# Patient Record
Sex: Female | Born: 1940 | Race: White | Hispanic: No | Smoking: Never smoker
Health system: Southern US, Community
[De-identification: ages and names within clinical notes are randomized; demographics above are authoritative.]

## PROBLEM LIST (undated history)

## (undated) DIAGNOSIS — M1990 Unspecified osteoarthritis, unspecified site: Secondary | ICD-10-CM

## (undated) DIAGNOSIS — I10 Essential (primary) hypertension: Secondary | ICD-10-CM

---

## 2011-01-30 DEATH — deceased

## 2017-02-05 ENCOUNTER — Emergency Department (HOSPITAL_COMMUNITY)
Admission: EM | Admit: 2017-02-05 | Discharge: 2017-02-06 | Disposition: A | Discharge status: Home / Self Care | Payer: Medicare HMO | Attending: Emergency Medicine | Admitting: Emergency Medicine

## 2017-02-05 ENCOUNTER — Emergency Department (HOSPITAL_COMMUNITY): Payer: Medicare HMO

## 2017-02-05 ENCOUNTER — Encounter (HOSPITAL_COMMUNITY): Payer: Self-pay | Admitting: Emergency Medicine

## 2017-02-05 DIAGNOSIS — R109 Unspecified abdominal pain: Secondary | ICD-10-CM | POA: Diagnosis present

## 2017-02-05 DIAGNOSIS — I10 Essential (primary) hypertension: Secondary | ICD-10-CM | POA: Insufficient documentation

## 2017-02-05 DIAGNOSIS — R1031 Right lower quadrant pain: Secondary | ICD-10-CM | POA: Diagnosis not present

## 2017-02-05 HISTORY — DX: Unspecified osteoarthritis, unspecified site: M19.90

## 2017-02-05 HISTORY — DX: Essential (primary) hypertension: I10

## 2017-02-05 LAB — CBC
HCT: 41.3 % (ref 36.0–46.0)
HEMOGLOBIN: 14.1 g/dL (ref 12.0–15.0)
MCH: 31.1 pg (ref 26.0–34.0)
MCHC: 34.1 g/dL (ref 30.0–36.0)
MCV: 91 fL (ref 78.0–100.0)
Platelets: 229 10*3/uL (ref 150–400)
RBC: 4.54 MIL/uL (ref 3.87–5.11)
RDW: 12.6 % (ref 11.5–15.5)
WBC: 9.4 10*3/uL (ref 4.0–10.5)

## 2017-02-05 LAB — COMPREHENSIVE METABOLIC PANEL
ALBUMIN: 4.1 g/dL (ref 3.5–5.0)
ALK PHOS: 58 U/L (ref 38–126)
ALT: 14 U/L (ref 14–54)
ANION GAP: 11 (ref 5–15)
AST: 21 U/L (ref 15–41)
BILIRUBIN TOTAL: 0.3 mg/dL (ref 0.3–1.2)
BUN: 14 mg/dL (ref 6–20)
CALCIUM: 9.2 mg/dL (ref 8.9–10.3)
CO2: 22 mmol/L (ref 22–32)
Chloride: 104 mmol/L (ref 101–111)
Creatinine, Ser: 0.91 mg/dL (ref 0.44–1.00)
GFR, EST NON AFRICAN AMERICAN: 59 mL/min — AB (ref >60)
GLUCOSE: 118 mg/dL — AB (ref 65–99)
Potassium: 3.9 mmol/L (ref 3.5–5.1)
Sodium: 137 mmol/L (ref 135–145)
TOTAL PROTEIN: 7.2 g/dL (ref 6.5–8.1)

## 2017-02-05 LAB — URINALYSIS, ROUTINE W REFLEX MICROSCOPIC
BILIRUBIN URINE: NEGATIVE
Glucose, UA: NEGATIVE mg/dL
Hgb urine dipstick: NEGATIVE
KETONES UR: NEGATIVE mg/dL
Leukocytes, UA: NEGATIVE
NITRITE: NEGATIVE
Protein, ur: NEGATIVE mg/dL
SPECIFIC GRAVITY, URINE: 1.017 (ref 1.005–1.030)
pH: 6 (ref 5.0–8.0)

## 2017-02-05 LAB — LIPASE, BLOOD: Lipase: 31 U/L (ref 11–51)

## 2017-02-05 MED ORDER — ONDANSETRON 4 MG PO TBDP
4.0000 mg | ORAL_TABLET | Freq: Once | ORAL | Status: AC | PRN
  Administered 2017-02-05: 4 mg via ORAL

## 2017-02-05 MED ORDER — IOPAMIDOL (ISOVUE-300) INJECTION 61%
INTRAVENOUS | Status: AC
  Administered 2017-02-05: 100 mL
  Filled 2017-02-05: qty 100

## 2017-02-05 MED ORDER — SODIUM CHLORIDE 0.9 % IV BOLUS (SEPSIS)
500.0000 mL | Freq: Once | INTRAVENOUS | Status: AC
  Administered 2017-02-05: 500 mL via INTRAVENOUS

## 2017-02-05 MED ORDER — ONDANSETRON 4 MG PO TBDP
ORAL_TABLET | ORAL | Status: AC
  Filled 2017-02-05: qty 1

## 2017-02-05 MED ORDER — ONDANSETRON HCL 4 MG/2ML IJ SOLN
4.0000 mg | Freq: Once | INTRAMUSCULAR | Status: AC
  Administered 2017-02-05: 4 mg via INTRAVENOUS
  Filled 2017-02-05: qty 2

## 2017-02-05 MED ORDER — MORPHINE SULFATE (PF) 4 MG/ML IV SOLN
4.0000 mg | Freq: Once | INTRAVENOUS | Status: AC
  Administered 2017-02-05: 4 mg via INTRAVENOUS
  Filled 2017-02-05: qty 1

## 2017-02-05 NOTE — ED Notes (Signed)
Patient transported to CT 

## 2017-02-05 NOTE — ED Triage Notes (Signed)
Pt to ER for evaluation of RLQ abdominal pain onset yesterday. [facility] and sent here for further evaluation. Reports nausea since yesterday and one episode of vomiting today. Denies diarrhea. Pt a/o x4. Reports hx of HTN. From [place], visiting family here.

## 2017-02-06 ENCOUNTER — Emergency Department
Admission: EM | Admit: 2017-02-06 | Discharge: 2017-02-06 | Disposition: A | Discharge status: Home / Self Care | Payer: Medicare HMO | Attending: Emergency Medicine | Admitting: Emergency Medicine

## 2017-02-06 ENCOUNTER — Encounter: Payer: Self-pay | Admitting: Emergency Medicine

## 2017-02-06 DIAGNOSIS — I10 Essential (primary) hypertension: Secondary | ICD-10-CM | POA: Diagnosis not present

## 2017-02-06 DIAGNOSIS — R1011 Right upper quadrant pain: Secondary | ICD-10-CM | POA: Insufficient documentation

## 2017-02-06 LAB — COMPREHENSIVE METABOLIC PANEL
ALBUMIN: 4.3 g/dL (ref 3.5–5.0)
ALT: 14 U/L (ref 14–54)
AST: 27 U/L (ref 15–41)
Alkaline Phosphatase: 63 U/L (ref 38–126)
Anion gap: 12 (ref 5–15)
BILIRUBIN TOTAL: 0.7 mg/dL (ref 0.3–1.2)
BUN: 17 mg/dL (ref 6–20)
CHLORIDE: 103 mmol/L (ref 101–111)
CO2: 24 mmol/L (ref 22–32)
Calcium: 9.3 mg/dL (ref 8.9–10.3)
Creatinine, Ser: 0.93 mg/dL (ref 0.44–1.00)
GFR calc Af Amer: >60 mL/min (ref >60)
GFR calc non Af Amer: 58 mL/min — ABNORMAL LOW (ref >60)
GLUCOSE: 106 mg/dL — AB (ref 65–99)
POTASSIUM: 3.5 mmol/L (ref 3.5–5.1)
Sodium: 139 mmol/L (ref 135–145)
TOTAL PROTEIN: 7.8 g/dL (ref 6.5–8.1)

## 2017-02-06 LAB — CBC
HEMATOCRIT: 41.8 % (ref 35.0–47.0)
HEMOGLOBIN: 14.5 g/dL (ref 12.0–16.0)
MCH: 32.3 pg (ref 26.0–34.0)
MCHC: 34.7 g/dL (ref 32.0–36.0)
MCV: 93.2 fL (ref 80.0–100.0)
Platelets: 205 10*3/uL (ref 150–440)
RBC: 4.48 MIL/uL (ref 3.80–5.20)
RDW: 12.6 % (ref 11.5–14.5)
WBC: 10 10*3/uL (ref 3.6–11.0)

## 2017-02-06 LAB — LIPASE, BLOOD: Lipase: 33 U/L (ref 11–51)

## 2017-02-06 MED ORDER — ONDANSETRON 4 MG PO TBDP: 4.0000 mg | ORAL_TABLET | Freq: Three times a day (TID) | ORAL | 0 refills | Status: AC | PRN

## 2017-02-06 MED ORDER — MORPHINE SULFATE (PF) 4 MG/ML IV SOLN
4.0000 mg | Freq: Once | INTRAVENOUS | Status: AC
  Administered 2017-02-06: 4 mg via INTRAVENOUS
  Filled 2017-02-06: qty 1

## 2017-02-06 MED ORDER — MORPHINE SULFATE (PF) 4 MG/ML IV SOLN: 4.0000 mg | Freq: Once | INTRAVENOUS | Status: DC

## 2017-02-06 MED ORDER — PROMETHAZINE HCL 12.5 MG PO TABS: 12.5000 mg | ORAL_TABLET | Freq: Four times a day (QID) | ORAL | 0 refills | Status: AC | PRN

## 2017-02-06 MED ORDER — SODIUM CHLORIDE 0.9 % IV SOLN
1000.0000 mL | Freq: Once | INTRAVENOUS | Status: AC
  Administered 2017-02-06: 1000 mL via INTRAVENOUS

## 2017-02-06 MED ORDER — ONDANSETRON HCL 4 MG/2ML IJ SOLN
4.0000 mg | Freq: Once | INTRAMUSCULAR | Status: AC
  Administered 2017-02-06: 4 mg via INTRAVENOUS
  Filled 2017-02-06: qty 2

## 2017-02-06 MED ORDER — PROMETHAZINE HCL 25 MG/ML IJ SOLN
12.5000 mg | Freq: Once | INTRAMUSCULAR | Status: AC
  Administered 2017-02-06: 12.5 mg via INTRAVENOUS
  Filled 2017-02-06: qty 1

## 2017-02-06 NOTE — ED Notes (Signed)
Pt reports she is having right sided pain for the last 3 days - pt reports nausea and vomiting but denies diarrhea - c/o headache - pt decreased appetite for the last 2 days - pt is able to drink - pt went to the er last pm and had labs and a CT scan (nothing was found per pt) - pt states she returned to the er today because the pain is still present

## 2017-02-06 NOTE — ED Provider Notes (Signed)
[facility] Emergency Department Provider Note   ____________________________________________    I have reviewed the triage vital signs and the nursing notes.   HISTORY  Chief Complaint Abdominal Pain     HPI Sara Cox is a 76 y.o. female who p/w right mid abdominal pain/flank pain x 2 days. She reports the pain makes her nauseated. Seen at [facility] ED last night had normal labs and CT. Pain has continued today. She has not taken any pain medication. Has taken zofran without relief. Reports she was recently mowing her lawn and thinks she may have overdone it. No fevers/chills. No cp. No vomiting. No diarrhea. ?constipation   Past Medical History:  Diagnosis Date  . Arthritis   . Hypertension     There are no active problems to display for this patient.   History reviewed. No pertinent surgical history.  Prior to Admission medications   Medication Sig Start Date End Date Taking? Authorizing Provider  ondansetron (ZOFRAN ODT) 4 MG disintegrating tablet Take 1 tablet (4 mg total) by mouth every 8 (eight) hours as needed. 02/06/17   Jerelyn Scott, MD  promethazine (PHENERGAN) 12.5 MG tablet Take 1 tablet (12.5 mg total) by mouth every 6 (six) hours as needed for nausea or vomiting. 02/06/17   Jene Every, MD     Allergies Penicillins  History reviewed. No pertinent family history.  Social History Social History  Substance Use Topics  . Smoking status: Never Smoker  . Smokeless tobacco: Never Used  . Alcohol use No    Review of Systems  Constitutional: No fever/chills Eyes: No visual changes.  ENT: No sore throat. Cardiovascular: Denies chest pain. Respiratory: Denies shortness of breath. Gastrointestinal: as above Genitourinary: Negative for dysuria. Musculoskeletal: chronic back pain. Skin: Negative for rash. Neurological: Negative for headaches or weakness   ____________________________________________   PHYSICAL  EXAM:  VITAL SIGNS: ED Triage Vitals  Enc Vitals Group     BP 02/06/17 1320 (!) 143/72     Pulse Rate 02/06/17 1320 70     Resp 02/06/17 1320 18     Temp 02/06/17 1320 98.7 F (37.1 C)     Temp Source 02/06/17 1320 Oral     SpO2 02/06/17 1320 97 %     Weight 02/06/17 1321 140 lb (63.5 kg)     Height 02/06/17 1321 5\' 3"  (1.6 m)     Head Circumference --      Peak Flow --      Pain Score 02/06/17 1320 8     Pain Loc --      Pain Edu? --      Excl. in GC? --     Constitutional: Alert and oriented. No acute distress. Pleasant and interactive  Nose: No congestion/rhinnorhea. Mouth/Throat: Mucous membranes are moist.   Neck:  Painless ROM Cardiovascular: Normal rate, regular rhythm. Grossly normal heart sounds.  Good peripheral circulation. Respiratory: Normal respiratory effort.  No retractions. Lungs CTAB. Gastrointestinal: Soft and nontender. No distention.  No CVA tenderness. Benign abdomen exam. Minimal ttp along the right flank, ?oblique injury, worse with rotation of torso Genitourinary: deferred Musculoskeletal:  Warm and well perfused Neurologic:  Normal speech and language. No gross focal neurologic deficits are appreciated.  Skin:  Skin is warm, dry and intact. No rash noted. Psychiatric: Mood and affect are normal. Speech and behavior are normal.  ____________________________________________   LABS (all labs ordered are listed, but only abnormal results are displayed)  Labs Reviewed  COMPREHENSIVE METABOLIC  PANEL - Abnormal; Notable for the following:       Result Value   Glucose, Bld 106 (*)    GFR calc non Af Amer 58 (*)    All other components within normal limits  CBC  LIPASE, BLOOD   ____________________________________________  EKG  none ____________________________________________  RADIOLOGY  Reviewed CT ____________________________________________   PROCEDURES  Procedure(s) performed: No    Critical Care performed:  No ____________________________________________   INITIAL IMPRESSION / ASSESSMENT AND PLAN / ED COURSE  Pertinent labs & imaging results that were available during my care of the patient were reviewed by me and considered in my medical decision making (see chart for details).  Patient with right flank pain, seems musculoskeletal. Exam is very reassuring. However given return to ED will recheck labs, give pain medications and evaluate to see if further imaging necessary. Differential MS, constipation, gall bladder, appy, pancreatitis, however zero ttp of abdomen so very unlikely  Patient had significant relief with pain medications. Recommended admission given unclear diagnosis for observation. Patient declined and family reports they are fine taking her home and are comfortable returning if she does not have improvement with pain medication. Strict return precautions discussed    ____________________________________________   FINAL CLINICAL IMPRESSION(S) / ED DIAGNOSES  Final diagnoses:  Right upper quadrant abdominal pain      NEW MEDICATIONS STARTED DURING THIS VISIT:  New Prescriptions   PROMETHAZINE (PHENERGAN) 12.5 MG TABLET    Take 1 tablet (12.5 mg total) by mouth every 6 (six) hours as needed for nausea or vomiting.     Note:  This document was prepared using Dragon voice recognition software and may include unintentional dictation errors.    Jene EveryKinner, Coby Shrewsberry, MD 02/06/17 [phone]

## 2017-02-06 NOTE — ED Notes (Signed)
IV infiltrated - Dr Cyril LoosenKinner notified - new IV to be started

## 2017-02-06 NOTE — ED Notes (Signed)
Pt states that she does not need anymore pain medicine at this time. Fluids infusing.

## 2017-02-06 NOTE — Discharge Instructions (Signed)
Return to the ED with any concerns including vomiting and not able to keep down liquids or your medications, abdominal pain especially if it localizes to the right lower abdomen, fever or chills, and decreased urine output, decreased level of alertness or lethargy, or any other alarming symptoms.  °

## 2017-02-06 NOTE — ED Notes (Signed)
Pt departed in NAD, refused use of wheelchair.  

## 2017-02-06 NOTE — ED Triage Notes (Signed)
Pt to ed with c/o right lower quad pain that started yesterday,  Pt states pain is constant but level of pain varies.  Pt reports was seen [facility] last night but the pain continues.

## 2017-02-06 NOTE — ED Notes (Signed)
Spoke with Dr Don PerkingVeronese regarding pt.  No orders received at this time.  Dr Don PerkingVeronese [facility]

## 2017-02-06 NOTE — ED Provider Notes (Signed)
[facility]-EMERGENCY DEPT Provider Note   CSN: [id number] Arrival date & time: 02/05/17  1825     History   Chief Complaint Chief Complaint  Patient presents with  . Abdominal Pain    HPI Sara Cox is a 76 y.o. female.  HPI  Pt presenting with c/o right sided abdominal pain.  Pain began last night and gradually worsened.  Kept her from sleeping all night.  Had an episode of vomiting today.  Also some nausea, decreased appetite today.  No fever/chills.  No dysuria.  No back or flank pain.  Pt was seen at an urgent care and referred to the ED for further evaluation of abdominal pain.  There are no other associated systemic symptoms, there are no other alleviating or modifying factors.   Past Medical History:  Diagnosis Date  . Arthritis   . Hypertension     There are no active problems to display for this patient.   History reviewed. No pertinent surgical history.  OB History    No data available       Home Medications    Prior to Admission medications   Medication Sig Start Date End Date Taking? Authorizing Provider  ondansetron (ZOFRAN ODT) 4 MG disintegrating tablet Take 1 tablet (4 mg total) by mouth every 8 (eight) hours as needed. 02/06/17   Jerelyn ScottLinker, Jaydeen Darley, MD    Family History History reviewed. No pertinent family history.  Social History Social History  Substance Use Topics  . Smoking status: Never Smoker  . Smokeless tobacco: Never Used  . Alcohol use No     Allergies   Penicillins   Review of Systems Review of Systems  ROS reviewed and all otherwise negative except for mentioned in HPI   Physical Exam Updated Vital Signs BP 127/63   Pulse 74   Temp 98.9 F (37.2 C) (Oral)   Resp 18   SpO2 100%  Vitals reviewed Physical Exam Physical Examination: General appearance - alert, well appearing, and in no distress Mental status - alert, oriented to person, place, and time Eyes - no conjunctival injection, no scleral icterus Mouth - mucous  membranes moist, pharynx normal without lesions Neck - supple, no significant adenopathy Chest - clear to auscultation, no wheezes, rales or rhonchi, symmetric air entry Heart - normal rate, regular rhythm, normal S1, S2, no murmurs, rubs, clicks or gallops Abdomen - soft, mild ttp over right mid abdomen and lower quadrant, nabs, nondistended, no masses or organomegaly Neurological - alert, oriented, normal speech Extremities - peripheral pulses normal, no pedal edema, no clubbing or cyanosis Skin - normal coloration and turgor, no rashes  ED Treatments / Results  Labs (all labs ordered are listed, but only abnormal results are displayed) Labs Reviewed  COMPREHENSIVE METABOLIC PANEL - Abnormal; Notable for the following:       Result Value   Glucose, Bld 118 (*)    GFR calc non Af Amer 59 (*)    All other components within normal limits  LIPASE, BLOOD  CBC  URINALYSIS, ROUTINE W REFLEX MICROSCOPIC    EKG  EKG Interpretation None       Radiology Ct Abdomen Pelvis W Contrast  Result Date: 02/05/2017 CLINICAL DATA:  Right lower quadrant pain, nausea, and vomiting since last night. Hypertension. EXAM: CT ABDOMEN AND PELVIS WITH CONTRAST TECHNIQUE: Multidetector CT imaging of the abdomen and pelvis was performed using the standard protocol following bolus administration of intravenous contrast. CONTRAST:  100mL ISOVUE-300 IOPAMIDOL (ISOVUE-300) INJECTION 61% COMPARISON:  None.  FINDINGS: Lower chest: Dependent atelectasis in the lung bases. Calcified granuloma in the left lung base. Calcified left mediastinal lymph node. Coronary artery calcification. Hepatobiliary: No focal liver abnormality is seen. No gallstones, gallbladder wall thickening, or biliary dilatation. Pancreas: Unremarkable. No pancreatic ductal dilatation or surrounding inflammatory changes. Spleen: Normal in size without focal abnormality. Adrenals/Urinary Tract: No adrenal gland nodules. Areas of scarring in both kidneys,  greater on the right, likely chronic. Subcentimeter low-attenuation lesions in both kidneys likely representing cysts. Nephrograms are otherwise homogeneous and symmetrical. No hydronephrosis or hydroureter. Bladder appears normal. Stomach/Bowel: Stomach, small bowel, and colon are decompressed. Scattered diverticula in the colon. No evidence of diverticulitis. The appendix is normal. Vascular/Lymphatic: Aortic atherosclerosis. No enlarged abdominal or pelvic lymph nodes. Reproductive: Uterus and bilateral adnexa are unremarkable. Other: No abdominal wall hernia or abnormality. No abdominopelvic ascites. Musculoskeletal: No acute or significant osseous findings. IMPRESSION: No acute process demonstrated in the abdomen or pelvis. No evidence of bowel obstruction or inflammation. Appendix is normal. Chronic renal parenchymal scarring. Aortic atherosclerosis. Electronically Signed   By: Burman Nieves M.D.   On: 02/05/2017 23:51    Procedures Procedures (including critical care time)  Medications Ordered in ED Medications  ondansetron (ZOFRAN-ODT) disintegrating tablet 4 mg (4 mg Oral Given 02/05/17 1858)  sodium chloride 0.9 % bolus 500 mL (0 mLs Intravenous Stopped 02/05/17 2341)  ondansetron (ZOFRAN) injection 4 mg (4 mg Intravenous Given 02/05/17 2241)  morphine 4 MG/ML injection 4 mg (4 mg Intravenous Given 02/05/17 2241)  iopamidol (ISOVUE-300) 61 % injection (100 mLs  Contrast Given 02/05/17 2324)     Initial Impression / Assessment and Plan / ED Course  I have reviewed the triage vital signs and the nursing notes.  Pertinent labs & imaging results that were available during my care of the patient were reviewed by me and considered in my medical decision making (see chart for details).     Pt presenting with c/o right lower abdominal pain, labs are reassuring, abdominal CT scan obtained which is also reassuring.  Pt treated with pain and nausea meds and was able to tolerate a fluids challenge.  No  findings of appendicitis, no diverticulits, SBO, kidney stone, or other emergent fidings in the ED.  Advised f/u with PMD if symptoms persist.  Discharged with strict return precautions.  Pt agreeable with plan.  Final Clinical Impressions(s) / ED Diagnoses   Final diagnoses:  Right lower quadrant abdominal pain    New Prescriptions Discharge Medication List as of 02/06/2017 12:30 AM    START taking these medications   Details  ondansetron (ZOFRAN ODT) 4 MG disintegrating tablet Take 1 tablet (4 mg total) by mouth every 8 (eight) hours as needed., Starting Wed 02/06/2017, Print         Jerelyn Scott, MD 02/06/17 [phone]

## 2017-02-12 ENCOUNTER — Emergency Department: Payer: Medicare HMO

## 2017-02-12 ENCOUNTER — Encounter: Payer: Self-pay | Admitting: Emergency Medicine

## 2017-02-12 ENCOUNTER — Observation Stay
Admission: EM | Admit: 2017-02-12 | Discharge: 2017-02-13 | Disposition: A | Discharge status: Home / Self Care | Payer: Medicare HMO | Attending: Internal Medicine | Admitting: Internal Medicine

## 2017-02-12 DIAGNOSIS — Z79899 Other long term (current) drug therapy: Secondary | ICD-10-CM | POA: Diagnosis not present

## 2017-02-12 DIAGNOSIS — K319 Disease of stomach and duodenum, unspecified: Secondary | ICD-10-CM | POA: Diagnosis not present

## 2017-02-12 DIAGNOSIS — R1110 Vomiting, unspecified: Secondary | ICD-10-CM

## 2017-02-12 DIAGNOSIS — R112 Nausea with vomiting, unspecified: Secondary | ICD-10-CM | POA: Diagnosis not present

## 2017-02-12 DIAGNOSIS — F419 Anxiety disorder, unspecified: Secondary | ICD-10-CM | POA: Diagnosis not present

## 2017-02-12 DIAGNOSIS — K2900 Acute gastritis without bleeding: Secondary | ICD-10-CM | POA: Insufficient documentation

## 2017-02-12 DIAGNOSIS — K760 Fatty (change of) liver, not elsewhere classified: Secondary | ICD-10-CM | POA: Diagnosis not present

## 2017-02-12 DIAGNOSIS — K2950 Unspecified chronic gastritis without bleeding: Secondary | ICD-10-CM | POA: Insufficient documentation

## 2017-02-12 DIAGNOSIS — B9681 Helicobacter pylori [H. pylori] as the cause of diseases classified elsewhere: Secondary | ICD-10-CM | POA: Insufficient documentation

## 2017-02-12 DIAGNOSIS — R1011 Right upper quadrant pain: Secondary | ICD-10-CM | POA: Insufficient documentation

## 2017-02-12 DIAGNOSIS — I10 Essential (primary) hypertension: Secondary | ICD-10-CM | POA: Diagnosis not present

## 2017-02-12 LAB — COMPREHENSIVE METABOLIC PANEL
ALT: 11 U/L — AB (ref 14–54)
AST: 18 U/L (ref 15–41)
Albumin: 4.3 g/dL (ref 3.5–5.0)
Alkaline Phosphatase: 61 U/L (ref 38–126)
Anion gap: 11 (ref 5–15)
BILIRUBIN TOTAL: 0.8 mg/dL (ref 0.3–1.2)
BUN: 20 mg/dL (ref 6–20)
CO2: 27 mmol/L (ref 22–32)
CREATININE: 0.92 mg/dL (ref 0.44–1.00)
Calcium: 9.4 mg/dL (ref 8.9–10.3)
Chloride: 101 mmol/L (ref 101–111)
GFR calc Af Amer: >60 mL/min (ref >60)
GFR, EST NON AFRICAN AMERICAN: 59 mL/min — AB (ref >60)
Glucose, Bld: 115 mg/dL — ABNORMAL HIGH (ref 65–99)
Potassium: 4.2 mmol/L (ref 3.5–5.1)
Sodium: 139 mmol/L (ref 135–145)
TOTAL PROTEIN: 7.7 g/dL (ref 6.5–8.1)

## 2017-02-12 LAB — CBC
HCT: 43.5 % (ref 35.0–47.0)
Hemoglobin: 14.9 g/dL (ref 12.0–16.0)
MCH: 31.4 pg (ref 26.0–34.0)
MCHC: 34.3 g/dL (ref 32.0–36.0)
MCV: 91.6 fL (ref 80.0–100.0)
PLATELETS: 234 10*3/uL (ref 150–440)
RBC: 4.75 MIL/uL (ref 3.80–5.20)
RDW: 12.8 % (ref 11.5–14.5)
WBC: 8.6 10*3/uL (ref 3.6–11.0)

## 2017-02-12 LAB — URINALYSIS, COMPLETE (UACMP) WITH MICROSCOPIC
BILIRUBIN URINE: NEGATIVE
Bacteria, UA: NONE SEEN
Glucose, UA: NEGATIVE mg/dL
HGB URINE DIPSTICK: NEGATIVE
Ketones, ur: NEGATIVE mg/dL
LEUKOCYTES UA: NEGATIVE
NITRITE: NEGATIVE
PH: 7 (ref 5.0–8.0)
Protein, ur: NEGATIVE mg/dL
SPECIFIC GRAVITY, URINE: 1.012 (ref 1.005–1.030)

## 2017-02-12 LAB — LIPASE, BLOOD: Lipase: 38 U/L (ref 11–51)

## 2017-02-12 MED ORDER — ACETAMINOPHEN 650 MG RE SUPP: 650.0000 mg | Freq: Four times a day (QID) | RECTAL | Status: DC | PRN

## 2017-02-12 MED ORDER — HYDROCODONE-ACETAMINOPHEN 5-325 MG PO TABS: 1.0000 | ORAL_TABLET | Freq: Four times a day (QID) | ORAL | Status: DC | PRN

## 2017-02-12 MED ORDER — MORPHINE SULFATE (PF) 2 MG/ML IV SOLN
2.0000 mg | Freq: Once | INTRAVENOUS | Status: AC
  Administered 2017-02-12: 2 mg via INTRAVENOUS
  Filled 2017-02-12: qty 1

## 2017-02-12 MED ORDER — PANTOPRAZOLE SODIUM 40 MG IV SOLR
40.0000 mg | Freq: Two times a day (BID) | INTRAVENOUS | Status: DC
Start: 2017-02-12 — End: 2017-02-13
  Administered 2017-02-12 – 2017-02-13 (×2): 40 mg via INTRAVENOUS
  Filled 2017-02-12 (×2): qty 40

## 2017-02-12 MED ORDER — LISINOPRIL 20 MG PO TABS
20.0000 mg | ORAL_TABLET | Freq: Every day | ORAL | Status: DC
  Administered 2017-02-13: 20 mg via ORAL
  Filled 2017-02-12 (×2): qty 1

## 2017-02-12 MED ORDER — LORAZEPAM 1 MG PO TABS
1.0000 mg | ORAL_TABLET | Freq: Two times a day (BID) | ORAL | Status: DC | PRN
  Administered 2017-02-12: 1 mg via ORAL
  Filled 2017-02-12: qty 1

## 2017-02-12 MED ORDER — ONDANSETRON HCL 4 MG PO TABS: 4.0000 mg | ORAL_TABLET | Freq: Four times a day (QID) | ORAL | Status: DC | PRN

## 2017-02-12 MED ORDER — MORPHINE SULFATE (PF) 2 MG/ML IV SOLN
2.0000 mg | INTRAVENOUS | Status: DC | PRN
  Administered 2017-02-12 – 2017-02-13 (×3): 2 mg via INTRAVENOUS
  Filled 2017-02-12 (×3): qty 1

## 2017-02-12 MED ORDER — ONDANSETRON HCL 4 MG/2ML IJ SOLN
4.0000 mg | Freq: Four times a day (QID) | INTRAMUSCULAR | Status: DC | PRN
  Administered 2017-02-12 – 2017-02-13 (×2): 4 mg via INTRAVENOUS
  Filled 2017-02-12: qty 2

## 2017-02-12 MED ORDER — ONDANSETRON HCL 4 MG/2ML IJ SOLN
4.0000 mg | Freq: Once | INTRAMUSCULAR | Status: AC
  Administered 2017-02-12: 4 mg via INTRAVENOUS
  Filled 2017-02-12: qty 2

## 2017-02-12 MED ORDER — ACETAMINOPHEN 325 MG PO TABS
650.0000 mg | ORAL_TABLET | Freq: Four times a day (QID) | ORAL | Status: DC | PRN
  Administered 2017-02-13: 650 mg via ORAL
  Filled 2017-02-12: qty 2

## 2017-02-12 MED ORDER — SODIUM CHLORIDE 0.9 % IV BOLUS (SEPSIS)
1000.0000 mL | Freq: Once | INTRAVENOUS | Status: AC
  Administered 2017-02-12: 1000 mL via INTRAVENOUS

## 2017-02-12 MED ORDER — ENOXAPARIN SODIUM 40 MG/0.4ML ~~LOC~~ SOLN
40.0000 mg | SUBCUTANEOUS | Status: DC
  Administered 2017-02-12: 40 mg via SUBCUTANEOUS
  Filled 2017-02-12: qty 0.4

## 2017-02-12 MED ORDER — SODIUM CHLORIDE 0.9 % IV SOLN
INTRAVENOUS | Status: DC
  Administered 2017-02-12 – 2017-02-13 (×3): via INTRAVENOUS

## 2017-02-12 NOTE — Consult Note (Signed)
Wyline MoodKiran Tylique Aull MD  [address] [place], [place] [address] Phone: [phone] Fax : [phone]  Consultation  Referring Provider:     DR Cherlynn KaiserSainani Primary Care Physician:  Patient, No Pcp Per Primary Gastroenterologist:  None          Reason for Consultation:     Nausea . Vomiting   Date of Admission:  02/12/2017 Date of Consultation:  02/12/2017         HPI:   Sara Cox is a 76 y.o. female who is visiting from [place]. She states she was doing well and has never been admitted to the hospital before. All of a sudden yesterday evening developed RUQ pain followed by nausea and vomiting last Monday  ,she was not able to keep any food down She was seen at[facility] on 02/05/2017 and states she was discharged after imaging , she went home and was unable to keep any food down and returned to the ER at [facility]. Since admission no vomiting and able to keep some fluids down. Denies any NSAID use or abdominal pain presently. No complaints presently . Denies any fevers or diarrhea or sick contacts.   RUQ USG showed hepatic steatosis  CT abdomen on 02/05/17 was normal   Past Medical History:  Diagnosis Date  . Arthritis   . Hypertension     History reviewed. No pertinent surgical history.  Prior to Admission medications   Medication Sig Start Date End Date Taking? Authorizing Provider  HYDROcodone-acetaminophen (NORCO/VICODIN) 5-325 MG tablet Take 1 tablet by mouth every 6 (six) hours as needed. 12/13/16  Yes [provider]  lisinopril (PRINIVIL,ZESTRIL) 20 MG tablet Take 20 mg by mouth daily. 12/18/16  Yes [provider]  LORazepam (ATIVAN) 1 MG tablet Take 1 mg by mouth 2 (two) times daily as needed. 01/15/17  Yes [provider]  ondansetron (ZOFRAN ODT) 4 MG disintegrating tablet Take 1 tablet (4 mg total) by mouth every 8 (eight) hours as needed. 02/06/17  Yes Jerelyn ScottLinker, Martha, MD  promethazine (PHENERGAN) 12.5 MG tablet Take 1 tablet (12.5 mg total) by mouth every 6  (six) hours as needed for nausea or vomiting. 02/06/17  Yes Jene EveryKinner, Robert, MD    Family History  Problem Relation Age of Onset  . Kidney disease Mother   . Heart attack Father   . Lupus Sister      Social History  Substance Use Topics  . Smoking status: Never Smoker  . Smokeless tobacco: Never Used  . Alcohol use No    Allergies as of 02/12/2017 - Review Complete 02/12/2017  Allergen Reaction Noted  . Penicillins Rash 02/05/2017    Review of Systems:    All systems reviewed and negative except where noted in HPI.   Physical Exam:  Vital signs in last 24 hours: Temp:  [98.5 F (36.9 C)-98.9 F (37.2 C)] 98.5 F (36.9 C) (05/15 2014) Pulse Rate:  [64-73] 69 (05/15 2014) Resp:  [16-18] 16 (05/15 2014) BP: (136-168)/(56-84) 136/65 (05/15 2014) SpO2:  [96 %-100 %] 98 % (05/15 2014) Weight:  [140 lb (63.5 kg)] 140 lb (63.5 kg) (05/15 1033)   General:   Pleasant, cooperative in NAD, lying comfortably in the bed  Head:  Normocephalic and atraumatic. Eyes:   No icterus.   Conjunctiva pink. PERRLA. Ears:  Normal auditory acuity. Neck:  Supple; no masses or thyroidomegaly Lungs: Respirations even and unlabored. Lungs clear to auscultation bilaterally.   No wheezes, crackles, or rhonchi.  Heart:  Regular rate and  rhythm;  Without murmur, clicks, rubs or gallops Abdomen:  Soft, nondistended, nontender. Normal bowel sounds. No appreciable masses or hepatomegaly.  No rebound or guarding.  Rectal:  Not performed. Msk:  Symmetrical without gross deformities.    Extremities:  Without edema, cyanosis or clubbing. Neurologic:  Alert and oriented x3;  grossly normal neurologically. Psych:  Alert and cooperative. Normal affect.  LAB RESULTS:  Recent Labs  02/12/17 1032  WBC 8.6  HGB 14.9  HCT 43.5  PLT 234   BMET  Recent Labs  02/12/17 1032  NA 139  K 4.2  CL 101  CO2 27  GLUCOSE 115*  BUN 20  CREATININE 0.92  CALCIUM 9.4   LFT  Recent Labs  02/12/17 1032    PROT 7.7  ALBUMIN 4.3  AST 18  ALT 11*  ALKPHOS 61  BILITOT 0.8   PT/INR No results for input(s): LABPROT, INR in the last 72 hours.  STUDIES: US Abdomen Limited Ruq  Result Date: 02/12/2017 CLINICAL DATA:  Upper abdominal pain with vomiting for 1 week EXAM: US ABDOMEN LIMITED - RIGHT UPPER QUADRANT COMPARISON:  CT abdomen and pelvis Feb 05, 2017 FINDINGS: Gallbladder: No gallstones or wall thickening visualized. There is no pericholecystic fluid. No sonographic Murphy sign noted by sonographer. Common bile duct: Diameter: 5 mm. No intrahepatic or extrahepatic biliary duct dilatation. Liver: No focal lesion identified. Liver echogenicity is diffusely increased. IMPRESSION: Diffuse increase in liver echogenicity, a finding most likely due to hepatic steatosis. While no focal liver lesions are evident, must be cautioned that sensitivity of ultrasound focal liver lesion detection is diminished in this circumstance. Study otherwise unremarkable. Electronically Signed   By: Bretta Bang III M.D.   On: 02/12/2017 13:51      Impression / Plan:   Sara Cox is a 76 y.o. y/o female with 1 week history of nausea, vomiting and abdominal pain. Normal CT scan of the abdomen . Normal Labs.  Impression - likely vomiting of infectious etiology which usually should resolve in 5-7 days   Plan  1. EGD in am due to non resolution of symptoms 2. PPI 3. NPO from midnight   I have discussed alternative options, risks & benefits,  which include, but are not limited to, bleeding, infection, perforation,respiratory complication & drug reaction.  The patient agrees with this plan & written consent will be obtained.     Thank you for involving me in the care of this patient.      LOS: 0 days   [name], MD  02/12/2017, 10:27 PM

## 2017-02-12 NOTE — ED Notes (Signed)
Patient transported to Ultrasound 

## 2017-02-12 NOTE — ED Notes (Signed)
MD at bedside. 

## 2017-02-12 NOTE — H&P (Signed)
[employer] [facility] [place] [facility]   PATIENT NAME: Sara Cox    MR#:  [id number]  DATE OF BIRTH:  1941-06-17  DATE OF ADMISSION:  02/12/2017  PRIMARY CARE PHYSICIAN: Non-local   REQUESTING/REFERRING PHYSICIAN: Dr. Bayard Males  CHIEF COMPLAINT:   Chief Complaint  Patient presents with  . Abdominal Pain    HISTORY OF PRESENT ILLNESS:  Sara Cox  is a 76 y.o. female with a known history of Essential hypertension, osteoarthritis who presents to the hospital due to right upper quadrant abdominal pain and also nausea and vomiting. This is patient's third visit to the emergency room in the past week. She was evaluated at [facility] and 02/05/2017 for similar symptoms underwent a CT scan of the abdomen and pelvis which showed no acute pathology. She was discharged home on some antinausea meds. She presented to the ER here at [facility] today after and also had a negative metabolic workup and is discharged home. She continues to have intractable nausea vomiting and has had poor by mouth intake for the past 3-4 days and still has the right upper quadrant abdominal pain. She underwent a right upper quadrant ultrasound which shows hepatic steatosis but no other acute abnormality, she continues to have intractable nausea vomiting and therefore hospitalist services were contacted further treatment and evaluation.  PAST MEDICAL HISTORY:   Past Medical History:  Diagnosis Date  . Arthritis   . Hypertension     PAST SURGICAL HISTORY:  History reviewed. No pertinent surgical history.  SOCIAL HISTORY:   Social History  Substance Use Topics  . Smoking status: Never Smoker  . Smokeless tobacco: Never Used  . Alcohol use No    FAMILY HISTORY:   Family History  Problem Relation Age of Onset  . Kidney disease Mother   . Heart attack Father   . Lupus Sister     DRUG ALLERGIES:   Allergies  Allergen Reactions  . Penicillins Rash    Has patient had a  PCN reaction causing immediate rash, facial/tongue/throat swelling, SOB or lightheadedness with hypotension: No Has patient had a PCN reaction causing severe rash involving mucus membranes or skin necrosis: No Has patient had a PCN reaction that required hospitalization No Has patient had a PCN reaction occurring within the last 10 years: No If all of the above answers are "NO", then may proceed with Cephalosporin use.     REVIEW OF SYSTEMS:   Review of Systems  Constitutional: Negative for fever and weight loss.  HENT: Negative for congestion, nosebleeds and tinnitus.   Eyes: Negative for blurred vision, double vision and redness.  Respiratory: Negative for cough, hemoptysis and shortness of breath.   Cardiovascular: Negative for chest pain, orthopnea, leg swelling and PND.  Gastrointestinal: Positive for abdominal pain, nausea and vomiting. Negative for diarrhea and melena.  Genitourinary: Negative for dysuria, hematuria and urgency.  Musculoskeletal: Negative for falls and joint pain.  Neurological: Negative for dizziness, tingling, sensory change, focal weakness, seizures, weakness and headaches.  Endo/Heme/Allergies: Negative for polydipsia. Does not bruise/bleed easily.  Psychiatric/Behavioral: Negative for depression and memory loss. The patient is not nervous/anxious.     MEDICATIONS AT HOME:   Prior to Admission medications   Medication Sig Start Date End Date Taking? Authorizing Provider  HYDROcodone-acetaminophen (NORCO/VICODIN) 5-325 MG tablet Take 1 tablet by mouth every 6 (six) hours as needed. 12/13/16  Yes [provider]  lisinopril (PRINIVIL,ZESTRIL) 20 MG tablet Take 20 mg by mouth daily. 12/18/16  Yes [provider]  LORazepam (ATIVAN) 1 MG tablet Take 1 mg by mouth 2 (two) times daily as needed. 01/15/17  Yes [provider]  ondansetron (ZOFRAN ODT) 4 MG disintegrating tablet Take 1 tablet (4 mg total) by mouth every 8 (eight) hours as  needed. 02/06/17  Yes Jerelyn ScottLinker, Martha, MD  promethazine (PHENERGAN) 12.5 MG tablet Take 1 tablet (12.5 mg total) by mouth every 6 (six) hours as needed for nausea or vomiting. 02/06/17  Yes Jene EveryKinner, Robert, MD      VITAL SIGNS:  Blood pressure (!) 145/71, pulse 70, temperature 98.5 F (36.9 C), temperature source Oral, resp. rate 18, height 5\' 3"  (1.6 m), weight 63.5 kg (140 lb), SpO2 96 %.  PHYSICAL EXAMINATION:  Physical Exam  GENERAL:  76 y.o.-year-old patient lying in bed in no acute distress.  EYES: Pupils equal, round, reactive to light and accommodation. No scleral icterus. Extraocular muscles intact.  HEENT: Head atraumatic, normocephalic. Oropharynx and nasopharynx clear. No oropharyngeal erythema, moist oral mucosa  NECK:  Supple, no jugular venous distention. No thyroid enlargement, no tenderness.  LUNGS: Normal breath sounds bilaterally, no wheezing, rales, rhonchi. No use of accessory muscles of respiration.  CARDIOVASCULAR: S1, S2 RRR. No murmurs, rubs, gallops, clicks.  ABDOMEN: Soft, Tender in RUQ, no rebound, rigidity, nondistended. Bowel sounds present. No organomegaly or mass.  EXTREMITIES: No pedal edema, cyanosis, or clubbing. + 2 pedal & radial pulses b/l.   NEUROLOGIC: Cranial nerves II through XII are intact. No focal Motor or sensory deficits appreciated b/l PSYCHIATRIC: The patient is alert and oriented x 3.  SKIN: No obvious rash, lesion, or ulcer.   LABORATORY PANEL:   CBC  Recent Labs Lab 02/12/17 1032  WBC 8.6  HGB 14.9  HCT 43.5  PLT 234   ------------------------------------------------------------------------------------------------------------------  Chemistries   Recent Labs Lab 02/12/17 1032  NA 139  K 4.2  CL 101  CO2 27  GLUCOSE 115*  BUN 20  CREATININE 0.92  CALCIUM 9.4  AST 18  ALT 11*  ALKPHOS 61  BILITOT 0.8    ------------------------------------------------------------------------------------------------------------------  Cardiac Enzymes No results for input(s): TROPONINI in the last 168 hours. ------------------------------------------------------------------------------------------------------------------  RADIOLOGY:  [place] Abdomen Limited Ruq  Result Date: 02/12/2017 CLINICAL DATA:  Upper abdominal pain with vomiting for 1 week EXAM: US ABDOMEN LIMITED - RIGHT UPPER QUADRANT COMPARISON:  CT abdomen and pelvis Feb 05, 2017 FINDINGS: Gallbladder: No gallstones or wall thickening visualized. There is no pericholecystic fluid. No sonographic Murphy sign noted by sonographer. Common bile duct: Diameter: 5 mm. No intrahepatic or extrahepatic biliary duct dilatation. Liver: No focal lesion identified. Liver echogenicity is diffusely increased. IMPRESSION: Diffuse increase in liver echogenicity, a finding most likely due to hepatic steatosis. While no focal liver lesions are evident, must be cautioned that sensitivity of ultrasound focal liver lesion detection is diminished in this circumstance. Study otherwise unremarkable. Electronically Signed   By: Bretta BangWilliam  Woodruff III M.D.   On: 02/12/2017 13:51     IMPRESSION AND PLAN:   76 year old female with past medical history of hypertension, osteoarthritis of presents to the hospital due to abdominal pain and intractable nausea vomiting.  1. Right Upper quadrant abdominal pain/intractable nausea vomiting-etiology unclear presently. Patient has had an extensive workup including a CT scan of the abdomen pelvis, right upper quadrant ultrasound which shows just hepatic steatosis but no other acute pathology. Her LFTs are normal. She is afebrile, heart white cell count is normal. -Admit her under observation with supportive care with  IV fluids, antiemetics, pain control. -Place on IV Protonix, get a gastroenterology consult and plan for upper GI endoscopy to rule  out any gastric outlet obstruction/peptic ulcer disease.  2. Essential hypertension-continue lisinopril.  3. Anxiety-continue Ativan.    All the records are reviewed and case discussed with ED provider. Management plans discussed with the patient, family and they are in agreement.  CODE STATUS: Full code  TOTAL TIME TAKING CARE OF THIS PATIENT: 40 minutes.    Houston Siren M.D on 02/12/2017 at 4:08 PM  Between 7am to 6pm - Pager - [phone]  After 6pm go to [email or web address] - password [email or web address]  [facility]  Office  [phone]  CC: Primary care physician; Patient, No Pcp Per

## 2017-02-12 NOTE — ED Provider Notes (Signed)
[facility] Emergency Department Provider Note    First MD Initiated Contact with Patient 02/12/17 1126     (approximate)  I have reviewed the triage vital signs and the nursing notes.   HISTORY  Chief Complaint Abdominal Pain   HPI Sara Cox is a 76 y.o. female with below list of chronic medical conditions presents to the emergency department with right flank/right lower quadrant abdominal pain for 1 week. Patient has been seen twice before in the emergency department for the same most recent of which 02/06/2017. Patient states that the pain is been consistent with her current pain score of 8 out of 10 described as sharp. Patient also admits to vomiting multiple episodes as nonbloody. Patient denies any diarrhea no fever no urinary symptoms. Patient denies any previous history of this pain   Past Medical History:  Diagnosis Date  . Arthritis   . Hypertension     Patient Active Problem List   Diagnosis Date Noted  . Intractable nausea and vomiting 02/12/2017    History reviewed. No pertinent surgical history.  Prior to Admission medications   Medication Sig Start Date End Date Taking? Authorizing Provider  HYDROcodone-acetaminophen (NORCO/VICODIN) 5-325 MG tablet Take 1 tablet by mouth every 6 (six) hours as needed. 12/13/16  Yes [provider]  lisinopril (PRINIVIL,ZESTRIL) 20 MG tablet Take 20 mg by mouth daily. 12/18/16  Yes [provider]  LORazepam (ATIVAN) 1 MG tablet Take 1 mg by mouth 2 (two) times daily as needed. 01/15/17  Yes [provider]  ondansetron (ZOFRAN ODT) 4 MG disintegrating tablet Take 1 tablet (4 mg total) by mouth every 8 (eight) hours as needed. 02/06/17  Yes Jerelyn ScottLinker, Martha, MD  promethazine (PHENERGAN) 12.5 MG tablet Take 1 tablet (12.5 mg total) by mouth every 6 (six) hours as needed for nausea or vomiting. 02/06/17  Yes Jene EveryKinner, Robert, MD    Allergies Penicillins  Family History  Problem  Relation Age of Onset  . Kidney disease Mother   . Heart attack Father   . Lupus Sister     Social History Social History  Substance Use Topics  . Smoking status: Never Smoker  . Smokeless tobacco: Never Used  . Alcohol use No    Review of Systems Constitutional: No fever/chills Eyes: No visual changes. ENT: No sore throat. Cardiovascular: Denies chest pain. Respiratory: Denies shortness of breath. Gastrointestinal: Positive for abdominal pain and vomiting Genitourinary: Negative for dysuria. Musculoskeletal: Negative for back pain. Integumentary: Negative for rash. Neurological: Negative for headaches, focal weakness or numbness.   ____________________________________________   PHYSICAL EXAM:  VITAL SIGNS: ED Triage Vitals  Enc Vitals Group     BP 02/12/17 1034 (!) 168/58     Pulse Rate 02/12/17 1033 68     Resp 02/12/17 1033 18     Temp 02/12/17 1033 98.5 F (36.9 C)     Temp Source 02/12/17 1033 Oral     SpO2 02/12/17 1033 100 %     Weight 02/12/17 1033 140 lb (63.5 kg)     Height 02/12/17 1033 5\' 3"  (1.6 m)     Head Circumference --      Peak Flow --      Pain Score 02/12/17 1032 8     Pain Loc --      Pain Edu? --      Excl. in GC? --     Constitutional: Alert and oriented. Well appearing and in no acute distress. Eyes: Conjunctivae are normal.  PERRL. EOMI. Head: Atraumatic. Mouth/Throat: Mucous membranes are moist. Oropharynx non-erythematous. Neck: No stridor.   Cardiovascular: Normal rate, regular rhythm. Good peripheral circulation. Grossly normal heart sounds. Respiratory: Normal respiratory effort.  No retractions. Lungs CTAB. Gastrointestinal: Right upper quadrant/right lower quadrant tenderness to palpation No distention.  Musculoskeletal: No lower extremity tenderness nor edema. No gross deformities of extremities. Neurologic:  Normal speech and language. No gross focal neurologic deficits are appreciated.  Skin:  Skin is warm, dry and  intact. No rash noted. Psychiatric: Mood and affect are normal. Speech and behavior are normal.  ____________________________________________   LABS (all labs ordered are listed, but only abnormal results are displayed)  Labs Reviewed  COMPREHENSIVE METABOLIC PANEL - Abnormal; Notable for the following:       Result Value   Glucose, Bld 115 (*)    ALT 11 (*)    GFR calc non Af Amer 59 (*)    All other components within normal limits  URINALYSIS, COMPLETE (UACMP) WITH MICROSCOPIC - Abnormal; Notable for the following:    Color, Urine YELLOW (*)    APPearance CLEAR (*)    Squamous Epithelial / LPF 0-5 (*)    All other components within normal limits  LIPASE, BLOOD  CBC     RADIOLOGY I, [place], personally viewed and evaluated these images (plain radiographs) as part of my medical decision making, as well as reviewing the written report by the radiologist.  US Abdomen Limited Ruq  Result Date: 02/12/2017 CLINICAL DATA:  Upper abdominal pain with vomiting for 1 week EXAM: US ABDOMEN LIMITED - RIGHT UPPER QUADRANT COMPARISON:  CT abdomen and pelvis Feb 05, 2017 FINDINGS: Gallbladder: No gallstones or wall thickening visualized. There is no pericholecystic fluid. No sonographic Murphy sign noted by sonographer. Common bile duct: Diameter: 5 mm. No intrahepatic or extrahepatic biliary duct dilatation. Liver: No focal lesion identified. Liver echogenicity is diffusely increased. IMPRESSION: Diffuse increase in liver echogenicity, a finding most likely due to hepatic steatosis. While no focal liver lesions are evident, must be cautioned that sensitivity of ultrasound focal liver lesion detection is diminished in this circumstance. Study otherwise unremarkable. Electronically Signed   By: Bretta Bang III M.D.   On: 02/12/2017 13:51     Procedures   ____________________________________________   INITIAL IMPRESSION / ASSESSMENT AND PLAN / ED COURSE  Pertinent labs &  imaging results that were available during my care of the patient were reviewed by me and considered in my medical decision making (see chart for details).  76 year old female presenting with abdominal pain. This is the patient's third visit to the emergency department for the same complaint CT scan performed on 02/05/2017 revealed no acute abnormality ultrasound performed today in the right upper quadrant consistent with possible hepatic steatosis. No clear etiology for the patient's abdominal pain however abdominal pain persists despite IV morphine administration. Patient discussed with Dr. Ailene Ravel for hospital admission for further evaluation and management      ____________________________________________  FINAL CLINICAL IMPRESSION(S) / ED DIAGNOSES  Final diagnoses:  Vomiting  RUQ pain     MEDICATIONS GIVEN DURING THIS VISIT:  Medications  HYDROcodone-acetaminophen (NORCO/VICODIN) 5-325 MG per tablet 1 tablet (not administered)  lisinopril (PRINIVIL,ZESTRIL) tablet 20 mg (20 mg Oral Not Given 02/12/17 2025)  LORazepam (ATIVAN) tablet 1 mg (not administered)  acetaminophen (TYLENOL) tablet 650 mg (not administered)    Or  acetaminophen (TYLENOL) suppository 650 mg (not administered)  ondansetron (ZOFRAN) tablet 4 mg ( Oral See Alternative 02/12/17  1621)    Or  ondansetron (ZOFRAN) injection 4 mg (4 mg Intravenous Given 02/12/17 1621)  enoxaparin (LOVENOX) injection 40 mg (40 mg Subcutaneous Given 02/12/17 2029)  0.9 %  sodium chloride infusion ( Intravenous New Bag/Given 02/12/17 1937)  pantoprazole (PROTONIX) injection 40 mg (40 mg Intravenous Given 02/12/17 2029)  morphine 2 MG/ML injection 2 mg (2 mg Intravenous Given 02/12/17 2025)  morphine 2 MG/ML injection 2 mg (2 mg Intravenous Given 02/12/17 1208)  ondansetron (ZOFRAN) injection 4 mg (4 mg Intravenous Given 02/12/17 1208)  sodium chloride 0.9 % bolus 1,000 mL (0 mLs Intravenous Stopped 02/12/17 1609)     NEW OUTPATIENT  MEDICATIONS STARTED DURING THIS VISIT:  Current Discharge Medication List      Current Discharge Medication List      Current Discharge Medication List       Note:  This document was prepared using Dragon voice recognition software and may include unintentional dictation errors.    Darci Current, MD 02/12/17 2211

## 2017-02-12 NOTE — ED Notes (Signed)
RN attempted to call US and was forwarded to answering machine. Pt updated on delay.

## 2017-02-12 NOTE — ED Triage Notes (Signed)
Pt c/o RLQ abdominal pain for 1 week with vomiting. Has been working up this week including CT but repors not better so here today. Still has pain and vomiting. No diarrhea or fevers.  NAD, unlabored respirations, skin warm and dry

## 2017-02-13 ENCOUNTER — Encounter
Admission: EM | Disposition: A | Discharge status: Home / Self Care | Payer: Self-pay | Source: Home / Self Care | Attending: Emergency Medicine

## 2017-02-13 ENCOUNTER — Observation Stay: Payer: Medicare HMO | Admitting: Anesthesiology

## 2017-02-13 ENCOUNTER — Encounter: Payer: Self-pay | Admitting: *Deleted

## 2017-02-13 DIAGNOSIS — R112 Nausea with vomiting, unspecified: Secondary | ICD-10-CM | POA: Diagnosis not present

## 2017-02-13 DIAGNOSIS — K2970 Gastritis, unspecified, without bleeding: Secondary | ICD-10-CM

## 2017-02-13 HISTORY — PX: ESOPHAGOGASTRODUODENOSCOPY: SHX5428

## 2017-02-13 SURGERY — EGD (ESOPHAGOGASTRODUODENOSCOPY)
Anesthesia: General

## 2017-02-13 MED ORDER — ONDANSETRON HCL 4 MG/2ML IJ SOLN
INTRAMUSCULAR | Status: AC
  Filled 2017-02-13: qty 2

## 2017-02-13 MED ORDER — PANTOPRAZOLE SODIUM 40 MG PO TBEC: 40.0000 mg | DELAYED_RELEASE_TABLET | Freq: Every day | ORAL | Status: DC

## 2017-02-13 MED ORDER — PROPOFOL 10 MG/ML IV BOLUS
INTRAVENOUS | Status: DC | PRN
  Administered 2017-02-13: 30 mg via INTRAVENOUS
  Administered 2017-02-13: 10 mg via INTRAVENOUS
  Administered 2017-02-13: 80 mg via INTRAVENOUS

## 2017-02-13 MED ORDER — PROPOFOL 10 MG/ML IV BOLUS
INTRAVENOUS | Status: AC
  Filled 2017-02-13: qty 20

## 2017-02-13 MED ORDER — PANTOPRAZOLE SODIUM 40 MG PO TBEC: 40.0000 mg | DELAYED_RELEASE_TABLET | Freq: Every day | ORAL | 0 refills | Status: AC

## 2017-02-13 NOTE — Anesthesia Preprocedure Evaluation (Signed)
Anesthesia Evaluation  Patient identified by MRN, date of birth, ID band Patient awake    Reviewed: Allergy & Precautions, NPO status , Patient's Chart, lab work & pertinent test results  History of Anesthesia Complications Negative for: history of anesthetic complications  Airway Mallampati: II       Dental   Pulmonary neg pulmonary ROS,           Cardiovascular hypertension, Pt. on medications      Neuro/Psych negative neurological ROS     GI/Hepatic negative GI ROS, Neg liver ROS,   Endo/Other  negative endocrine ROS  Renal/GU negative Renal ROS     Musculoskeletal   Abdominal   Peds  Hematology negative hematology ROS (+)   Anesthesia Other Findings   Reproductive/Obstetrics                             Anesthesia Physical Anesthesia Plan  ASA: II  Anesthesia Plan: General   Post-op Pain Management:    Induction: Intravenous  Airway Management Planned: Nasal Cannula  Additional Equipment:   Intra-op Plan:   Post-operative Plan:   Informed Consent: I have reviewed the patients History and Physical, chart, labs and discussed the procedure including the risks, benefits and alternatives for the proposed anesthesia with the patient or authorized representative who has indicated his/her understanding and acceptance.     Plan Discussed with:   Anesthesia Plan Comments:         Anesthesia Quick Evaluation

## 2017-02-13 NOTE — H&P (Signed)
  Wyline MoodKiran Brizeyda Holtmeyer MD [address] [place], [place] [address] Phone: [phone] Fax : [phone]  Primary Care Physician:  Patient, No Pcp Per Primary Gastroenterologist:  Dr. Wyline MoodKiran Jazmin Vensel   Pre-Procedure History & Physical: HPI:  Sara Cox is a 76 y.o. female is here for an endoscopy.   Past Medical History:  Diagnosis Date  . Arthritis   . Hypertension     History reviewed. No pertinent surgical history.  Prior to Admission medications   Medication Sig Start Date End Date Taking? Authorizing Provider  HYDROcodone-acetaminophen (NORCO/VICODIN) 5-325 MG tablet Take 1 tablet by mouth every 6 (six) hours as needed. 12/13/16  Yes [provider]  lisinopril (PRINIVIL,ZESTRIL) 20 MG tablet Take 20 mg by mouth daily. 12/18/16  Yes [provider]  LORazepam (ATIVAN) 1 MG tablet Take 1 mg by mouth 2 (two) times daily as needed. 01/15/17  Yes [provider]  ondansetron (ZOFRAN ODT) 4 MG disintegrating tablet Take 1 tablet (4 mg total) by mouth every 8 (eight) hours as needed. 02/06/17  Yes Jerelyn ScottLinker, Martha, MD  promethazine (PHENERGAN) 12.5 MG tablet Take 1 tablet (12.5 mg total) by mouth every 6 (six) hours as needed for nausea or vomiting. 02/06/17  Yes Jene EveryKinner, Robert, MD    Allergies as of 02/12/2017 - Review Complete 02/12/2017  Allergen Reaction Noted  . Penicillins Rash 02/05/2017    Family History  Problem Relation Age of Onset  . Kidney disease Mother   . Heart attack Father   . Lupus Sister     Social History   Social History  . Marital status: Widowed    Spouse name: N/A  . Number of children: N/A  . Years of education: N/A   Occupational History  . Not on file.   Social History Main Topics  . Smoking status: Never Smoker  . Smokeless tobacco: Never Used  . Alcohol use No  . Drug use: No  . Sexual activity: Not on file   Other Topics Concern  . Not on file   Social History Narrative  . No narrative on file    Review of  Systems: See HPI, otherwise negative ROS  Physical Exam: BP (!) 151/62   Pulse 66   Temp 98 F (36.7 C) (Tympanic)   Resp 16   Ht 5\' 3"  (1.6 m)   Wt 140 lb (63.5 kg)   SpO2 99%   BMI 24.80 kg/m  General:   Alert,  pleasant and cooperative in NAD Head:  Normocephalic and atraumatic. Neck:  Supple; no masses or thyromegaly. Lungs:  Clear throughout to auscultation.    Heart:  Regular rate and rhythm. Abdomen:  Soft, nontender and nondistended. Normal bowel sounds, without guarding, and without rebound.   Neurologic:  Alert and  oriented x4;  grossly normal neurologically.  Impression/Plan: Sara Biudrey Taglieri is here for an endoscopy to be performed for nausea and vomiting   Risks, benefits, limitations, and alternatives regarding  endoscopy have been reviewed with the patient.  Questions have been answered.  All parties agreeable.   Wyline MoodKiran Berdene Askari, MD  02/13/2017, 11:34 AM

## 2017-02-13 NOTE — Transfer of Care (Signed)
Immediate Anesthesia Transfer of Care Note  Patient: Sara Cox  Procedure(s) Performed: Procedure(s): ESOPHAGOGASTRODUODENOSCOPY (EGD) (N/A)  Patient Location: PACU and Endoscopy Unit  Anesthesia Type:General  Level of Consciousness: sedated and responds to stimulation  Airway & Oxygen Therapy: Patient Spontanous Breathing and Patient connected to nasal cannula oxygen  Post-op Assessment: Report given to RN and Post -op Vital signs reviewed and stable  Post vital signs: Reviewed and stable  Last Vitals:  Vitals:   02/13/17 0959 02/13/17 1151  BP: (!) 151/62 137/66  Pulse: 66 63  Resp: 16 20  Temp: 36.7 C 36.4 C    Last Pain:  Vitals:   02/13/17 1151  TempSrc: Tympanic  PainSc:          Complications: No apparent anesthesia complications

## 2017-02-13 NOTE — Progress Notes (Signed)
Pt A and O x 4. VSS. Pt tolerating diet well. No complaints of pain or nausea. IV removed intact, prescriptions given. Pt voiced understanding of discharge instructions with no further questions. Pt discharged via wheelchair with nurse.   

## 2017-02-13 NOTE — Op Note (Signed)
[facility] Patient Name: Sara Cox Procedure Date: 02/13/2017 11:33 AM MRN: [id number] Account #: [id number] Date of Birth: 1941/02/23 Admit Type: Inpatient Age: 76 Room: [facility] ENDO ROOM 3 Gender: Female Note Status: Finalized Procedure:            Upper GI endoscopy Indications:          Nausea with vomiting Providers:            [name] MD, MD Medicines:            Monitored Anesthesia Care Complications:        No immediate complications. Procedure:            Pre-Anesthesia Assessment:                       - Prior to the procedure, a History and Physical was                        performed, and patient medications, allergies and                        sensitivities were reviewed. The patient's tolerance of                        previous anesthesia was reviewed.                       - ASA Grade Assessment: II - A patient with mild                        systemic disease.                       After obtaining informed consent, the endoscope was                        passed under direct vision. Throughout the procedure,                        the patient's blood pressure, pulse, and oxygen                        saturations were monitored continuously. The Endoscope                        was introduced through the mouth, and advanced to the                        third part of duodenum. The upper GI endoscopy was                        accomplished with ease. The patient tolerated the                        procedure well. Findings:      The esophagus was normal.      The examined duodenum was normal.      Diffuse mild inflammation characterized by congestion (edema) and       erythema was found in the entire examined stomach. Biopsies were taken       with a cold forceps for histology. Impression:           -  Normal esophagus.                       - Normal examined duodenum.                       - Gastritis.  Biopsied. Recommendation:       - Return patient to hospital ward for ongoing care.                       - Advance diet as tolerated.                       - Continue PPI                       - Await pathology results.                       - Avoid NSAID's Procedure Code(s):    --- Professional ---                       [phone], Esophagogastroduodenoscopy, flexible, transoral;                        with biopsy, single or multiple Diagnosis Code(s):    --- Professional ---                       K29.70, Gastritis, unspecified, without bleeding                       R11.2, Nausea with vomiting, unspecified CPT copyright 2016 American Medical Association. All rights reserved. The codes documented in this report are preliminary and upon coder review may  be revised to meet current compliance requirements. Wyline MoodKiran Itzia Cunliffe, MD Wyline MoodKiran Senai Kingsley MD, MD 02/13/2017 11:49:25 AM This report has been signed electronically. Number of Addenda: 0 Note Initiated On: 02/13/2017 11:33 AM      [facility]

## 2017-02-13 NOTE — Discharge Summary (Signed)
[facility] [place] [facility]   PATIENT NAME: Sara Cox    MR#:  [id number]  DATE OF BIRTH:  02/07/1941  DATE OF ADMISSION:  02/12/2017 ADMITTING PHYSICIAN: Houston SirenVivek J Sainani, MD  DATE OF DISCHARGE: 02/13/2017  PRIMARY CARE PHYSICIAN: Patient, No Pcp Per    ADMISSION DIAGNOSIS:  Vomiting [R11.10] RUQ pain [R10.11]  DISCHARGE DIAGNOSIS:  Intractable nausea vomiting resolved at present Acute gastritis SECONDARY DIAGNOSIS:   Past Medical History:  Diagnosis Date  . Arthritis   . Hypertension     HOSPITAL COURSE:   76 year old female with past medical history of hypertension, osteoarthritis of presents to the hospital due to abdominal pain and intractable nausea vomiting.  1. Right Upper quadrant abdominal pain/intractable nausea vomiting-etiology unclear presently. Patient has had an extensive workup including a CT scan of the abdomen pelvis, right upper quadrant ultrasound which shows just hepatic steatosis but no other acute pathology. Her LFTs are normal. She is afebrile, heart white cell count is normal. -Received IV fluids, antiemetics, pain control. -on IV Protonix -Appreciated gastroenterology consult  -Status post EGD showed acute gastritis -Start clear liquid and patient was placed on by mouth Protonix  2. Essential hypertension-continue lisinopril.  3. Anxiety-continue Ativan.  He shows improvement will DC home later. This goes with patient and son Roland EarlJeffrey Karrer  CONSULTS OBTAINED:  Treatment Team:  Wyline MoodAnna, Kiran, MD  DRUG ALLERGIES:   Allergies  Allergen Reactions  . Penicillins Rash    Has patient had a PCN reaction causing immediate rash, facial/tongue/throat swelling, SOB or lightheadedness with hypotension: No Has patient had a PCN reaction causing severe rash involving mucus membranes or skin necrosis: No Has patient had a PCN reaction that required hospitalization No Has patient had a PCN reaction occurring within  the last 10 years: No If all of the above answers are "NO", then may proceed with Cephalosporin use.     DISCHARGE MEDICATIONS:   Current Discharge Medication List    START taking these medications   Details  pantoprazole (PROTONIX) 40 MG tablet Take 1 tablet (40 mg total) by mouth daily. Qty: 30 tablet, Refills: 0      CONTINUE these medications which have NOT CHANGED   Details  HYDROcodone-acetaminophen (NORCO/VICODIN) 5-325 MG tablet Take 1 tablet by mouth every 6 (six) hours as needed.    lisinopril (PRINIVIL,ZESTRIL) 20 MG tablet Take 20 mg by mouth daily.    LORazepam (ATIVAN) 1 MG tablet Take 1 mg by mouth 2 (two) times daily as needed.    ondansetron (ZOFRAN ODT) 4 MG disintegrating tablet Take 1 tablet (4 mg total) by mouth every 8 (eight) hours as needed. Qty: 12 tablet, Refills: 0    promethazine (PHENERGAN) 12.5 MG tablet Take 1 tablet (12.5 mg total) by mouth every 6 (six) hours as needed for nausea or vomiting. Qty: 30 tablet, Refills: 0        If you experience worsening of your admission symptoms, develop shortness of breath, life threatening emergency, suicidal or homicidal thoughts you must seek medical attention immediately by calling 911 or calling your MD immediately  if symptoms less severe.  You Must read complete instructions/literature along with all the possible adverse reactions/side effects for all the Medicines you take and that have been prescribed to you. Take any new Medicines after you have completely understood and accept all the possible adverse reactions/side effects.   Please note  You were cared for by a hospitalist during your hospital stay. If you  have any questions about your discharge medications or the care you received while you were in the hospital after you are discharged, you can call the unit and asked to speak with the hospitalist on call if the hospitalist that took care of you is not available. Once you are discharged, your  primary care physician will handle any further medical issues. Please note that NO REFILLS for any discharge medications will be authorized once you are discharged, as it is imperative that you return to your primary care physician (or establish a relationship with a primary care physician if you do not have one) for your aftercare needs so that they can reassess your need for medications and monitor your lab values. Today   SUBJECTIVE   No vomiting since yesterday  VITAL SIGNS:  Blood pressure (!) 152/57, pulse 69, temperature 98.8 F (37.1 C), temperature source Oral, resp. rate 20, height 5\' 3"  (1.6 m), weight 63.5 kg (140 lb), SpO2 98 %.  I/O:   Intake/Output Summary (Last 24 hours) at 02/13/17 1404 Last data filed at 02/13/17 1300  Gross per 24 hour  Intake             3408 ml  Output              700 ml  Net             2708 ml    PHYSICAL EXAMINATION:  GENERAL:  76 y.o.-year-old patient lying in the bed with no acute distress.  EYES: Pupils equal, round, reactive to light and accommodation. No scleral icterus. Extraocular muscles intact.  HEENT: Head atraumatic, normocephalic. Oropharynx and nasopharynx clear.  NECK:  Supple, no jugular venous distention. No thyroid enlargement, no tenderness.  LUNGS: Normal breath sounds bilaterally, no wheezing, rales,rhonchi or crepitation. No use of accessory muscles of respiration.  CARDIOVASCULAR: S1, S2 normal. No murmurs, rubs, or gallops.  ABDOMEN: Soft, non-tender, non-distended. Bowel sounds present. No organomegaly or mass.  EXTREMITIES: No pedal edema, cyanosis, or clubbing.  NEUROLOGIC: Cranial nerves II through XII are intact. Muscle strength 5/5 in all extremities. Sensation intact. Gait not checked.  PSYCHIATRIC: The patient is alert and oriented x 3.  SKIN: No obvious rash, lesion, or ulcer.   DATA REVIEW:   CBC   Recent Labs Lab 02/12/17 1032  WBC 8.6  HGB 14.9  HCT 43.5  PLT 234    Chemistries   Recent  Labs Lab 02/12/17 1032  NA 139  K 4.2  CL 101  CO2 27  GLUCOSE 115*  BUN 20  CREATININE 0.92  CALCIUM 9.4  AST 18  ALT 11*  ALKPHOS 61  BILITOT 0.8    Microbiology Results   No results found for this or any previous visit (from the past 240 hour(s)).  RADIOLOGY:  US Abdomen Limited Ruq  Result Date: 02/12/2017 CLINICAL DATA:  Upper abdominal pain with vomiting for 1 week EXAM: US ABDOMEN LIMITED - RIGHT UPPER QUADRANT COMPARISON:  CT abdomen and pelvis Feb 05, 2017 FINDINGS: Gallbladder: No gallstones or wall thickening visualized. There is no pericholecystic fluid. No sonographic Murphy sign noted by sonographer. Common bile duct: Diameter: 5 mm. No intrahepatic or extrahepatic biliary duct dilatation. Liver: No focal lesion identified. Liver echogenicity is diffusely increased. IMPRESSION: Diffuse increase in liver echogenicity, a finding most likely due to hepatic steatosis. While no focal liver lesions are evident, must be cautioned that sensitivity of ultrasound focal liver lesion detection is diminished in this circumstance. Study otherwise unremarkable. Electronically  Signed   By: Bretta Bang III M.D.   On: 02/12/2017 13:51     Management plans discussed with the patient, family and they are in agreement.  CODE STATUS:     Code Status Orders        Start     Ordered   02/12/17 1834  Full code  Continuous     02/12/17 1833    Code Status History    Date Active Date Inactive Code Status Order ID Comments User Context   This patient has a current code status but no historical code status.    Advance Directive Documentation     Most Recent Value  Type of Advance Directive  Healthcare Power of Attorney, Living will  Pre-existing out of facility DNR order (yellow form or pink MOST form)  -  "MOST" Form in Place?  -      TOTAL TIME TAKING CARE OF THIS PATIENT: 40 minutes.    Jersey Espinoza M.D on 02/13/2017 at 2:04 PM  Between 7am to 6pm - Pager -  [phone] After 6pm go to [email or web address] - password [email or web address]  [facility]  Office  [phone]  CC: Primary care physician; Patient, No Pcp Per

## 2017-02-13 NOTE — Anesthesia Postprocedure Evaluation (Signed)
Anesthesia Post Note  Patient: Sara Cox  Procedure(s) Performed: Procedure(s) (LRB): ESOPHAGOGASTRODUODENOSCOPY (EGD) (N/A)  Patient location during evaluation: Endoscopy Anesthesia Type: General Level of consciousness: awake and alert Pain management: pain level controlled Vital Signs Assessment: post-procedure vital signs reviewed and stable Respiratory status: spontaneous breathing and respiratory function stable Cardiovascular status: stable Anesthetic complications: no     Last Vitals:  Vitals:   02/13/17 1151 02/13/17 1201  BP: 137/66 (!) 147/66  Pulse: 63 61  Resp: 20 20  Temp: 36.4 C     Last Pain:  Vitals:   02/13/17 1151  TempSrc: Tympanic  PainSc:                  Wm Sahagun K

## 2017-02-13 NOTE — Progress Notes (Signed)
Per Dr. Tobi BastosAnna okay to place order for clear liquids and advance diet as tolerated.

## 2017-02-13 NOTE — Discharge Instructions (Signed)
Patient advised to continue full liquid diet for the next couple days. She'll follow up with her primary care physician when she returns home in [place] Patient advised to come back to the emergency room and sent symptoms worsen.

## 2017-02-13 NOTE — Care Management Obs Status (Signed)
MEDICARE OBSERVATION STATUS NOTIFICATION   Patient Details  Name: Sara Cox MRN: [id number] Date of Birth: 04/08/1941   Medicare Observation Status Notification Given:  No (admitted obs less than 24 hours)    Chapman FitchBOWEN, Alyn Jurney T, RN 02/13/2017, 2:49 PM

## 2017-02-13 NOTE — Anesthesia Post-op Follow-up Note (Cosign Needed)
Anesthesia QCDR form completed.        

## 2017-02-14 ENCOUNTER — Encounter: Payer: Self-pay | Admitting: Gastroenterology

## 2017-02-14 LAB — SURGICAL PATHOLOGY

## 2017-02-19 ENCOUNTER — Other Ambulatory Visit: Payer: Self-pay

## 2017-02-19 ENCOUNTER — Telehealth: Payer: Self-pay

## 2017-02-19 NOTE — Telephone Encounter (Signed)
-----   Message from Wyline MoodKiran Anna, MD sent at 02/19/2017 12:24 PM EDT ----- H pylori positive- needs omeprazole 40mg  once daily , flagyl 500 mg TID , clarithromycin 500 mg BID for 14 days, recheck H pylori stool antigen in 6 weeks . Repeat EGD in 6 months for gastric mapping as she has intestinal metaplasia

## 2017-02-19 NOTE — Telephone Encounter (Signed)
Spoke to patient and advised of results per Dr. Tobi BastosAnna.   H pylori positive- needs omeprazole 40mg  once daily , flagyl 500 mg TID , clarithromycin 500 mg BID for 14 days, recheck H pylori stool antigen in 6 weeks . Repeat EGD in 6 months for gastric mapping as she has intestinal metaplasia   Patient states she is back in [place] and seeing PCP Ewell PoeSteve Hanor. Requested that lab information and Rx order be forwarded to Dr. Vergie LivingHanor for admistration.   [facility] [phone]) and faxed information [phone])

## 2018-11-03 IMAGING — US US ABDOMEN LIMITED
1 series · 14 of 25 positions shown · non-contrast
Comparison: CT abdomen and pelvis February 05, 2017

CLINICAL DATA: Upper abdominal pain with vomiting for 1 week

EXAM:
US ABDOMEN LIMITED - RIGHT UPPER QUADRANT

[Series 1: us abdomen limited · 0.20mm/px · 14 of 56 slices shown]
[im 1/56]
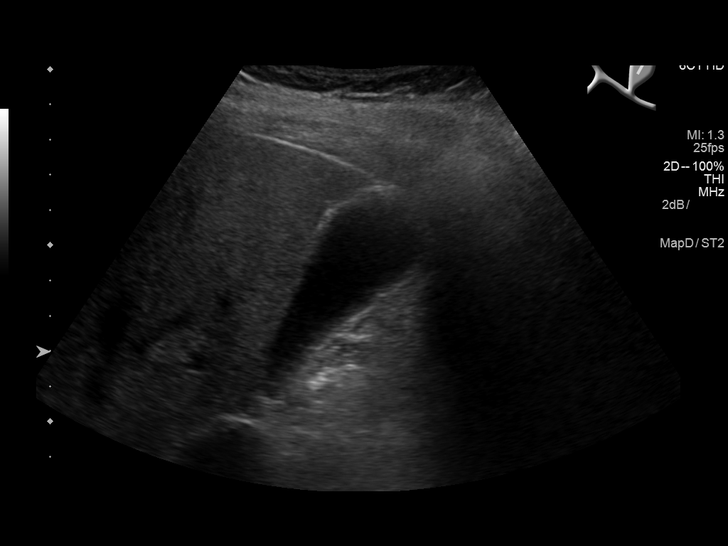
[im 5/56]
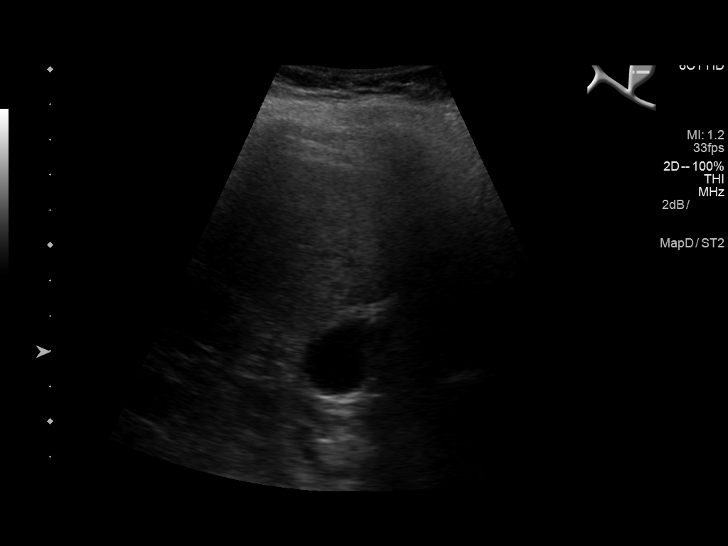
[im 10/56]
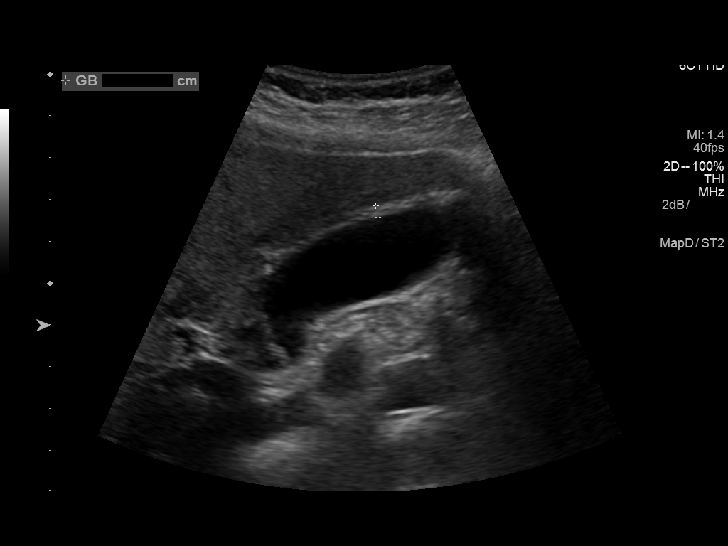
[im 14/56]
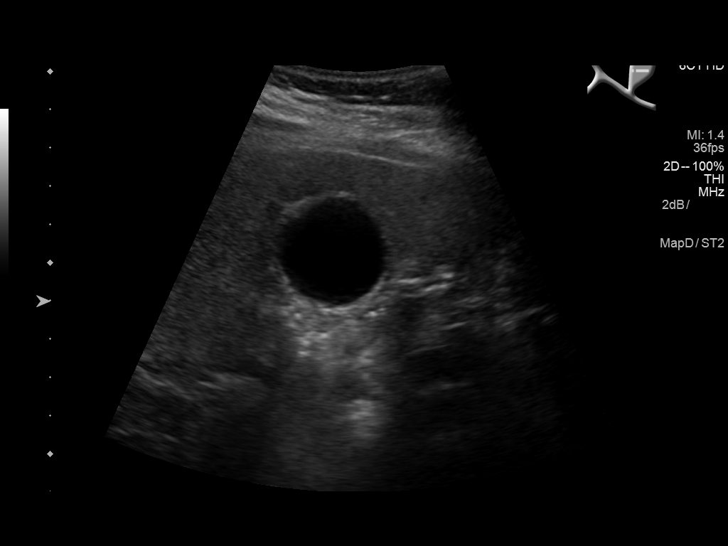
[im 19/56]
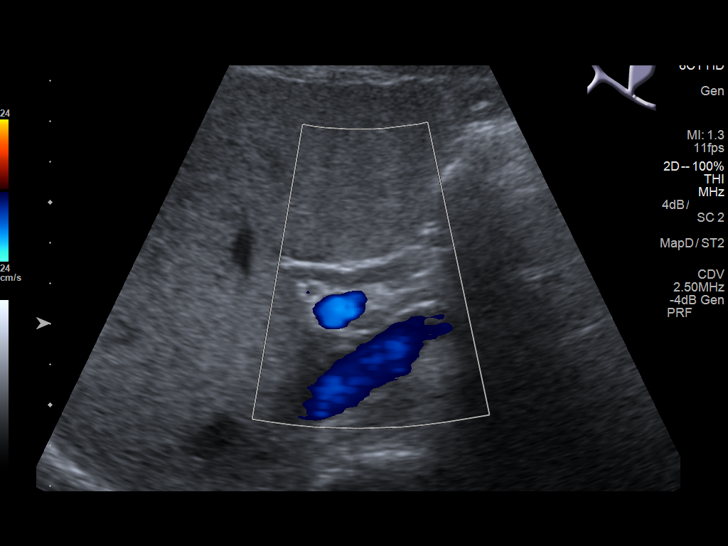
[im 21/56]
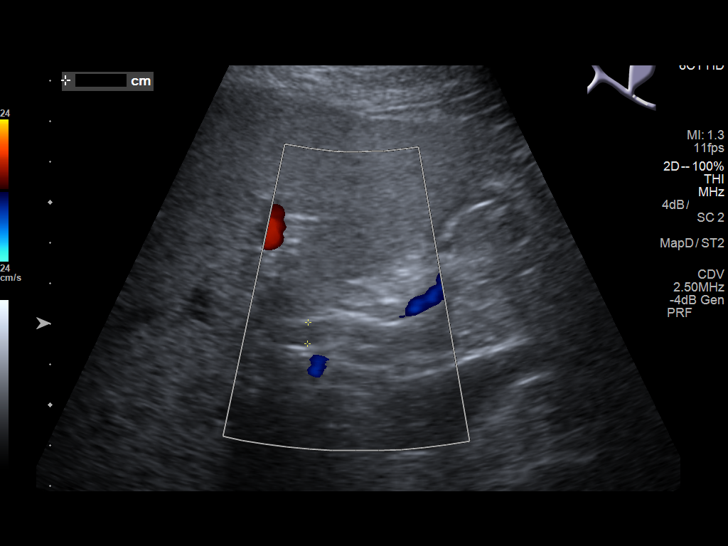
[im 26/56]
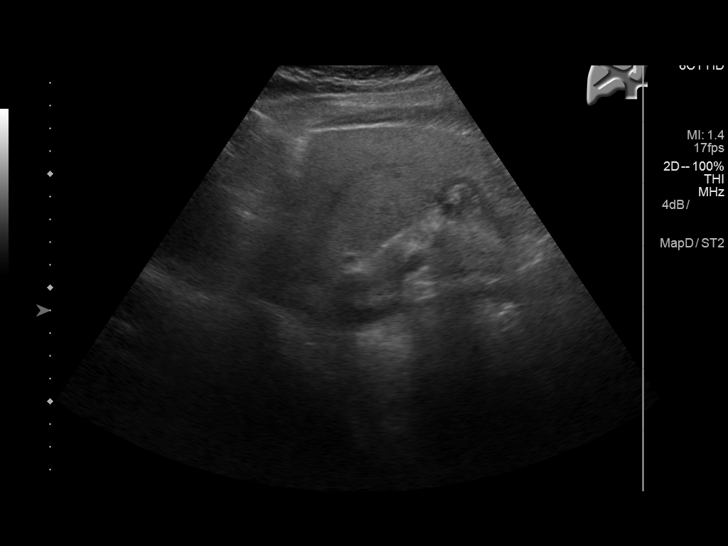
[im 30/56]
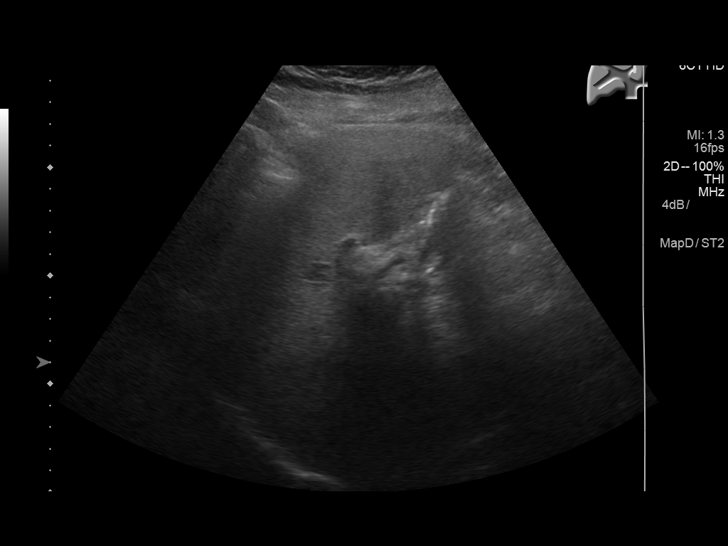
[im 35/56]
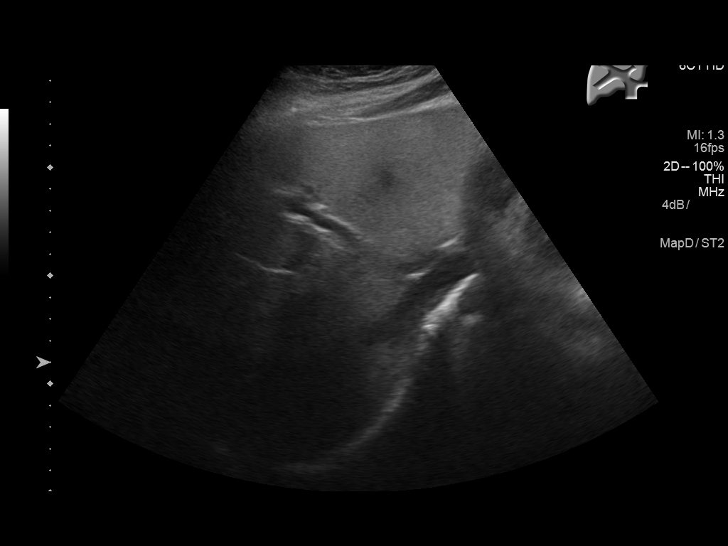
[im 37/56]
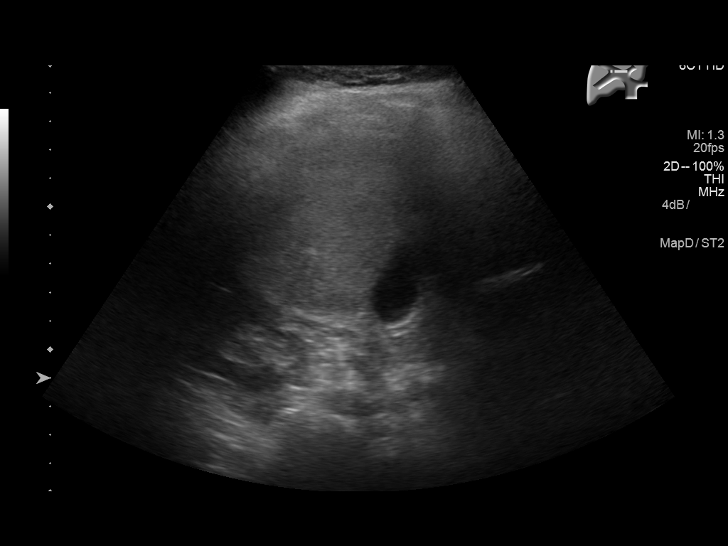
[im 42/56]
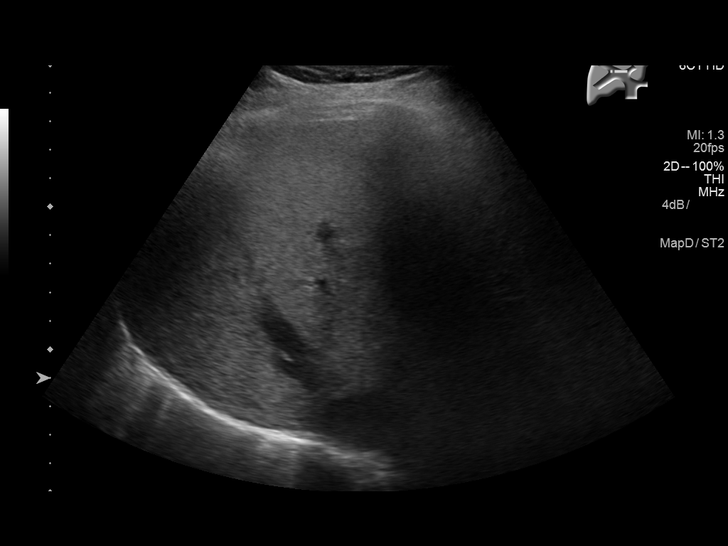
[im 46/56]
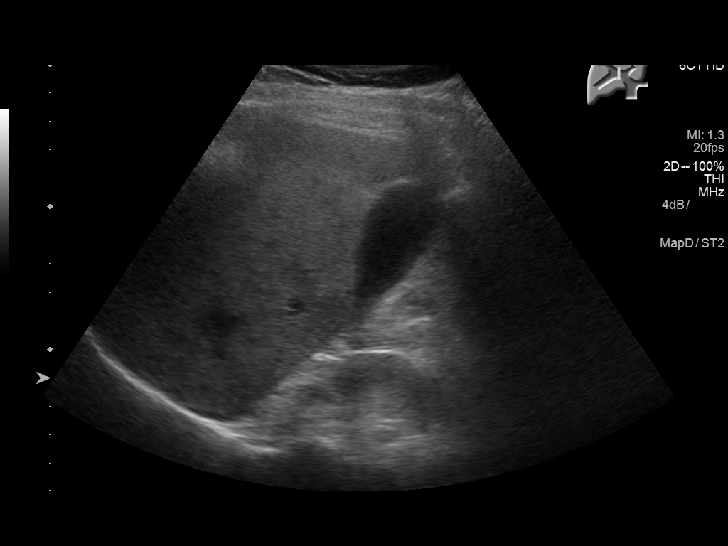
[im 51/56]
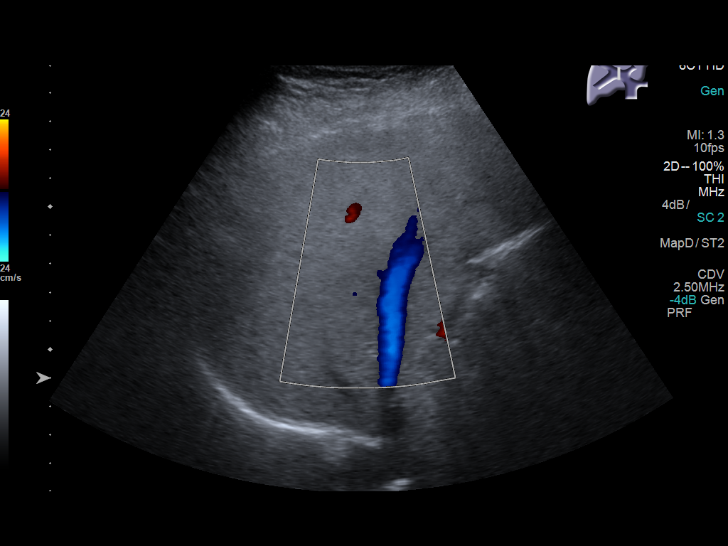
[im 56/56]
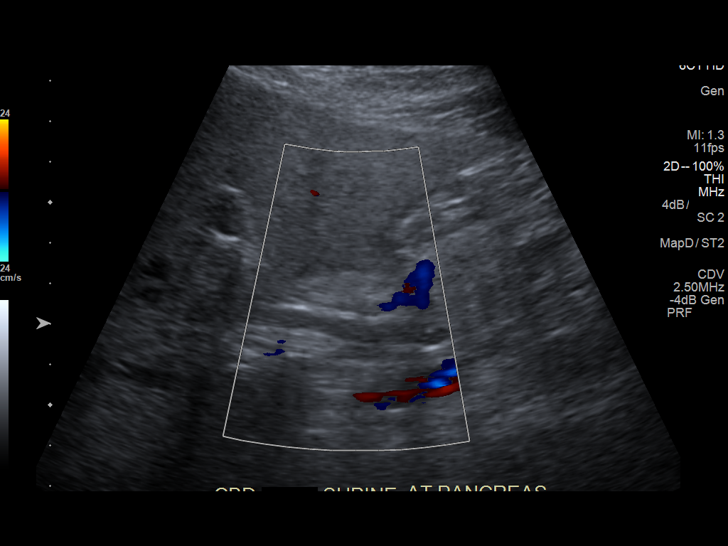

[14 of 25 positions shown; findings below may reference images not displayed]

FINDINGS: Gallbladder:

No gallstones or wall thickening visualized. There is no
pericholecystic fluid. No sonographic Murphy sign noted by
sonographer.

Common bile duct:

Diameter: 5 mm. No intrahepatic or extrahepatic biliary duct
dilatation.

Liver:

No focal lesion identified. Liver echogenicity is diffusely
increased.
IMPRESSION: Diffuse increase in liver echogenicity, a finding most likely due to
hepatic steatosis. While no focal liver lesions are evident, must be
cautioned that sensitivity of ultrasound focal liver lesion
detection is diminished in this circumstance.

Study otherwise unremarkable.
